# Patient Record
Sex: Female | Born: 1980 | Race: White | Hispanic: No | Smoking: Never smoker
Health system: Southern US, Community
[De-identification: ages and names within clinical notes are randomized; demographics above are authoritative.]

## PROBLEM LIST (undated history)

## (undated) DIAGNOSIS — F988 Other specified behavioral and emotional disorders with onset usually occurring in childhood and adolescence: Secondary | ICD-10-CM

## (undated) DIAGNOSIS — F419 Anxiety disorder, unspecified: Secondary | ICD-10-CM

## (undated) HISTORY — DX: Other specified behavioral and emotional disorders with onset usually occurring in childhood and adolescence: F98.8

## (undated) HISTORY — DX: Anxiety disorder, unspecified: F41.9

---
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
## 2001-06-26 ENCOUNTER — Other Ambulatory Visit: Admission: RE | Admit: 2001-06-26 | Discharge: 2001-06-26 | Payer: Self-pay | Admitting: *Deleted

## 2002-11-12 ENCOUNTER — Other Ambulatory Visit: Admission: RE | Admit: 2002-11-12 | Discharge: 2002-11-12 | Payer: Self-pay | Admitting: *Deleted

## 2003-06-06 ENCOUNTER — Observation Stay (HOSPITAL_COMMUNITY): Admission: AD | Admit: 2003-06-06 | Discharge: 2003-06-07 | Payer: Self-pay | Admitting: Obstetrics and Gynecology

## 2003-07-11 ENCOUNTER — Inpatient Hospital Stay (HOSPITAL_COMMUNITY): Admission: AD | Admit: 2003-07-11 | Discharge: 2003-07-12 | Payer: Self-pay | Admitting: Obstetrics and Gynecology

## 2003-09-09 ENCOUNTER — Inpatient Hospital Stay (HOSPITAL_COMMUNITY): Admission: AD | Admit: 2003-09-09 | Discharge: 2003-09-09 | Payer: Self-pay | Admitting: Obstetrics and Gynecology

## 2003-09-15 ENCOUNTER — Other Ambulatory Visit: Admission: RE | Admit: 2003-09-15 | Discharge: 2003-09-15 | Payer: Self-pay | Admitting: Obstetrics and Gynecology

## 2003-09-30 ENCOUNTER — Other Ambulatory Visit: Admission: RE | Admit: 2003-09-30 | Discharge: 2003-09-30 | Payer: Self-pay | Admitting: Obstetrics and Gynecology

## 2003-10-14 ENCOUNTER — Inpatient Hospital Stay (HOSPITAL_COMMUNITY): Admission: AD | Admit: 2003-10-14 | Discharge: 2003-10-14 | Payer: Self-pay | Admitting: Obstetrics and Gynecology

## 2003-12-10 ENCOUNTER — Inpatient Hospital Stay (HOSPITAL_COMMUNITY): Admission: AD | Admit: 2003-12-10 | Discharge: 2003-12-11 | Payer: Self-pay | Admitting: Obstetrics and Gynecology

## 2003-12-11 ENCOUNTER — Inpatient Hospital Stay (HOSPITAL_COMMUNITY): Admission: AD | Admit: 2003-12-11 | Discharge: 2003-12-13 | Payer: Self-pay | Admitting: Obstetrics and Gynecology

## 2003-12-14 ENCOUNTER — Encounter: Admission: RE | Admit: 2003-12-14 | Discharge: 2004-01-13 | Payer: Self-pay | Admitting: Obstetrics and Gynecology

## 2004-06-22 ENCOUNTER — Other Ambulatory Visit: Admission: RE | Admit: 2004-06-22 | Discharge: 2004-06-22 | Payer: Self-pay | Admitting: Obstetrics and Gynecology

## 2005-07-17 ENCOUNTER — Other Ambulatory Visit: Admission: RE | Admit: 2005-07-17 | Discharge: 2005-07-17 | Payer: Self-pay | Admitting: Obstetrics and Gynecology

## 2006-12-26 ENCOUNTER — Inpatient Hospital Stay (HOSPITAL_COMMUNITY): Admission: AD | Admit: 2006-12-26 | Discharge: 2006-12-28 | Payer: Self-pay | Admitting: Obstetrics and Gynecology

## 2007-08-06 ENCOUNTER — Other Ambulatory Visit: Admission: RE | Admit: 2007-08-06 | Discharge: 2007-08-06 | Payer: Self-pay | Admitting: Obstetrics and Gynecology

## 2007-11-08 ENCOUNTER — Emergency Department (HOSPITAL_COMMUNITY): Admission: EM | Admit: 2007-11-08 | Discharge: 2007-11-09 | Payer: Self-pay | Admitting: Emergency Medicine

## 2008-07-29 ENCOUNTER — Other Ambulatory Visit: Admission: RE | Admit: 2008-07-29 | Discharge: 2008-07-29 | Payer: Self-pay | Admitting: Obstetrics and Gynecology

## 2009-06-30 ENCOUNTER — Encounter: Payer: Self-pay | Admitting: Internal Medicine

## 2009-08-06 ENCOUNTER — Other Ambulatory Visit: Admission: RE | Admit: 2009-08-06 | Discharge: 2009-08-06 | Payer: Self-pay | Admitting: Obstetrics and Gynecology

## 2010-08-21 ENCOUNTER — Encounter: Payer: Self-pay | Admitting: Orthopedic Surgery

## 2010-12-13 NOTE — H&P (Signed)
[name], [name]               ACCOUNT NO.:  [id number]   MEDICAL RECORD NO.:  [id number]          PATIENT TYPE:  INP   LOCATION:                                [facility]:  [facility]   PHYSICIAN:  Charles A. Delcambre, MDDATE OF BIRTH:  1980-09-26   DATE OF ADMISSION:  12/26/2006  DATE OF DISCHARGE:                              HISTORY & PHYSICAL   HISTORY OF PRESENT ILLNESS:  This patient is a 30 year old para 1, 0-2-  1.  EDC will be Dec 26, 2006.  She has blood pressures increasing with  headache today, some scotomata, and contractions were about every 6  minutes.  Blood pressures are in the 140s/90s, 142/92, 144/96.  We did  get a set of PIH labs, and these were normal with creatinine 0.6, uric  acid 5.3, AST 16, ALT 11, hemoglobin 11.9, hematocrit 34.7, and  platelets 189.  With these symptoms, blood pressure and symptomatic  headache, that is off and on at this point, we will go ahead and induce  her tomorrow.  I do not feel she warrants emergent admission at this  time.  She is given precautions regarding further headache or her  scotomata, to report to the hospital immediately.   PAST MEDICAL HISTORY:  Insomnia, ADD.   PAST SURGICAL HISTORY:  SVD x1.   MEDICATIONS:  1. Xanax.  2. Ambien.  3. Adderall.  4. Tandem.  5. Axert.   ALLERGIES:  NO KNOWN DRUG ALLERGIES.   SOCIAL HISTORY:  No tobacco, ethanol, or drug exposure in the past.  She  is married, in a monogamous relationship with her husband.  She had high  risk __________ in the past.   FAMILY HISTORY:  Denies family history of breast, uterus, ovary, cervix,  colon cancer, coronary artery disease, stroke, diabetes, hypertension.   REVIEW OF SYSTEMS:  No fever, chills, rashes or lesions.  She has had a  headache as noted above.  Some vision, blurred vision, and scotomata,  but these have resolved.  No dizziness.  No seasonal allergies.  No  chest pain, shortness of breath, wheezing.  No diarrhea, constipation,  bleeding, melena, hematochezia, urgency, frequency, dysuria,  incontinence, hematuria, galactorrhea, emotional changes.   PHYSICAL EXAMINATION:  GENERAL:  Alert and oriented x3 in no distress.  HEENT:  Grossly within normal limits.  NECK:  Supple without thyromegaly or adenopathy.  CHEST:  Lungs clear bilaterally.  HEART:  Regular rate and rhythm.  A 2/6 systolic ejection murmur at left  sternal border.  BREASTS:  No masses, tenderness, discharge, skin or nipple changes  bilaterally.  ABDOMEN:  Fundal height of 40.  Estimated fetal weight 3400 grams.  PELVIC:  The cervix is 3 cm, 50 percent effaced, mid placed, -1 station,  vertex, back water bulging slightly, soft.  Otherwise, normal external  female genitalia.  EXTREMITIES:  Mild edema of lower extremities bilaterally.  Deep tendon  reflexes are 2+ and symmetrical bilaterally.   ASSESSMENT:  1. Intrauterine pregnancy at 30 weeks and induction.  2. Pregnancy-associated hypertension.  PIH labs are negative today.      Urinalysis  is negative for protein today.  She has 5 pounds of      weight gain over the last week.   PLAN:  We will go ahead and get her induced.  Plan induction at 9:39 to  9:30 p.m. tomorrow night with low dose Pitocin.  If blood pressures  elevate further, consider mag prophylaxis and repeat PIH panel.  Otherwise, she will present as directed.  Blood type is AB negative.  Antibody negative.  VDRL nonreactive.  Rubella immune.  Hepatitis B  surface antigen negative.  HIV nonreactive.  One hour Glucola 71.  Antibody screen negative.  Hemoglobin 28 weeks 11.7.  HIV negative 36  weeks.  Group B strep negative.  She was group B strep positive last  pregnancy but had no sequela.  She declined quad screen and declined  first trimester screen.  Cystic fibrosis was negative.  Pap was  negative.  GC/Chlamydia initially were negative.  TSH was negative.  We  will proceed as outlined.      Charles A. Sydnee Cabal, MD   Electronically Signed     CAD/MEDQ  D:  12/25/2006  T:  12/25/2006  Job:  [id number]

## 2010-12-16 NOTE — Consult Note (Signed)
NAME:  [name], [name]                          ACCOUNT NO.:  [id number]   MEDICAL RECORD NO.:  [id number]                   PATIENT TYPE:  MAT   LOCATION:  [facility]                                 [facility]ACILITY:  [facility]   PHYSICIAN:  Juan H. Lily Peer, M.D.             DATE OF BIRTH:  1981-04-26   DATE OF CONSULTATION:  07/11/2003  DATE OF DISCHARGE:  07/12/2003                                   CONSULTATION   CHIEF COMPLAINT:  Nausea and vomiting.   HISTORY:  The patient is a 30 year old gravida 3, para 0, AB 2 currently at  [redacted] weeks gestation who presented to [facility] today complaining of a  three-day history of nausea and vomiting, which started with some diarrhea.  Today she has been unable to keep any food down or any liquids, and  presented with these complaints.   PHYSICAL EXAMINATION:  GENERAL APPEARANCE:  On arrival to the emergency room  she is alert and oriented, and in no acute distress and in good spirits.  VITAL SIGNS:  The patient's vital signs are as follows; her temperature is  98.5, pulse 64, respirations 18 and blood pressure 111/53.  Fetal heart  tones  145-155.  HEENT:  Head, eyes, ears, nose, and mouth are unremarkable.  Of note, the  oral mucosa is tenacious with saliva with some mild dehydration evident.  NECK:  Neck supple.  Trachea midline.  No carotid bruits.  No thyromegaly.  HEART:  Heart is regular rate and rhythm.  No murmurs or gallops.  BREASTS AND PELVIC:  Breast and pelvic exams are not done.  ABDOMEN:  There is no abdominal tenderness.  Positive fetal heart tones are  appreciated.   LABORATORY DATA:  The patient underwent a urinalysis, which demonstrated a  pH of 6.0, specific gravity 1.020, there was greater than 80 mg/dcl of  ketones and no evidence of infection.  Her white blood count was 11.2,  hemoglobin and hematocrit were 12.1 and 35.3 respectively with a platelet  count of 211,000.  Serum electrolytes:  Sodium 135, potassium 3.4, chloride  104, and  CO2 25.   ASSESSMENT:  [age]-year-old gravida 3, para 0, AB 2 at [redacted] weeks  gestation with apparent viral gastroenteritis and some mild ketonuria noted  on clean catch urinalysis.   The patient will be hydrated with D5 LR with a 500 mL bolus and we will give  her 10 mg of Reglan mixed with a liter of IV fluids.  We will complete the  entire liter of IV fluids and then recheck her urine.  Then after ketones  have resolved we will release the patient home with a prescription for  Zofran ODT 8 mg sublingual one daily p.r.n. over the next 24-48 hours.  She  was instructed that once at home to stay on liquids for  a 24-hour period to  allow for GI tract rest; and, for loose stools  she may take Imodium or  Kaopectate p.r.n.  After 24 hours she can advance her diet to soft and then  finally to a regular diet.  If there is no resolution to the above symptoms  with the diarrhea within the  72 hours she will present to the office of  Dr. Sylvester Harder and possibly  consider a stool for ova and parasites.  The patient denies her husband or  any other family members with any similar complaints.   All questions were answered and we will follow accordingly.                                               Juan H. Lily Peer, M.D.    JHF/MEDQ  D:  07/11/2003  T:  07/12/2003  Job:  [id number]   cc:   Fayrene Fearing A. Ashley Royalty, M.D.  [address]  [place], [place] [address]  Fax: [phone]

## 2010-12-16 NOTE — H&P (Signed)
NAME:  [name], [name]                          ACCOUNT NO.:  [id number]   MEDICAL RECORD NO.:  [id number]                   PATIENT TYPE:  OBV   LOCATION:  9325                                 [facility]ACILITY:  [facility]   PHYSICIAN:  Timothy P. Fontaine, M.D.           DATE OF BIRTH:  02/11/1981   DATE OF ADMISSION:  06/06/2003  DATE OF DISCHARGE:                                HISTORY & PHYSICAL   CHIEF COMPLAINT:  Nausea, vomiting, pregnancy.   HISTORY OF PRESENT ILLNESS:  A 30 year old G3, P0, AB 2, female at  approximately 18 weeks' gestation who enters with increasing nausea and  vomiting over the last 48 to 72 hours.  The patient notes she has always had  a problem during the pregnancy with some nausea and vomiting.  She has been  on oral Phenergan.  She was actually given a prescription for Phenergan  suppositories although never filled them.  Noted since Friday evening  progressive nausea and vomiting such that she could not keep anything down.  She has no fevers or chills.  She has a little bit of diarrhea and otherwise  she has had an uncomplicated pregnancy per her history.   PAST MEDICAL HISTORY:  Uncomplicated.   PAST SURGICAL HISTORY:  D&C.   ALLERGIES:  Allergies with no medications.   FAMILY HISTORY:  Noncontributory.   SOCIAL HISTORY:  Noncontributory.   ADMISSION PHYSICAL EXAM:  VITAL SIGNS:  Afebrile.  Vital signs are stable.  HEENT:  Normal.  LUNGS:  Clear.  CARDIAC:  Regular rate without rubs, murmurs, or gallops.  ABDOMEN:  Exam with active bowel sounds, soft, nontender without masses,  guarding, rebound, or organomegaly.  Positive fetal heart tones per nursing.  PELVIC:  Deferred.   LABORATORY EVALUATION:  Normal comprehensive metabolic panel with the  exception of a marginal low sodium at 133 and a low glucose at 66.  Normal  liver functions.  Urine analysis is negative with the exception of greater  than 80 ketones.  CBC:  White count 10.8, hemoglobin 12.9,  platelet count  185,000.   ASSESSMENT AND PLAN:  A 30 year old G3, P0, approximately 13 weeks'  gestation.  History of  nausea and vomiting with pregnancy with acute  exacerbation, questionable viral component versus hyperemesis.  The patient  has evidence of dehydration with positive ketones in her urine.  Admit.  IV  hydrate.  Phenergan IV.  The patient relates feeling better after receiving  two liters of IV fluid, currently having the third.  Will finish the third  liter, and if she continues to feel well, we will discharge home.  She will  have the prescription for Phenergan suppositories filled  and will use these on a as needed basis.  Follow up with Dr. Ashley Royalty the  day after discharge in the office.  She does have an appointment at the end  of the week routinely and she will keep  that appointment.  She will call his  office sooner if she has recurrent nausea and vomiting.                                               Timothy P. Audie Box, M.D.    TPF/MEDQ  D:  06/07/2003  T:  06/07/2003  Job:  [id number]

## 2010-12-16 NOTE — H&P (Signed)
NAME:  [name], [name]                          ACCOUNT NO.:  [id number]   MEDICAL RECORD NO.:  [id number]                   PATIENT TYPE:  INP   LOCATION:  9164                                 [facility]ILITY:  [facility]   PHYSICIAN:  Juan H. Lily Peer, M.D.             DATE OF BIRTH:  April 06, 1981   DATE OF ADMISSION:  12/11/2003  DATE OF DISCHARGE:                                HISTORY & PHYSICAL   CHIEF COMPLAINT:  Spontaneous rupture of membranes.   HISTORY:  The patient is a 30 year old gravida 3 para 0 AB 2 currently [redacted]  weeks gestation who has been seen in Dr. Leonette Most Delcambre's office this  morning for routine visit and he was in the process of planning induction  next week due to the possibility the patient may have underlying labile PIH.  This afternoon she called and presented to the emergency room complaining of  ruptured membranes.  She was nitrazine positive and gross rupture of  membranes, clear, was evident.  Her cervix is 3-4 cm, 80-90%, -3 station and  she was brought in to be delivered.  Review of her [place] form is  somewhat limited, but based on the patient's history due to the fact that  she is AB negative she received RhoGAM at 36 weeks - she stated it was  approximately 3 weeks ago.  No other abnormalities reported in her pregnancy  except a labile PIH and her positive group B strep culture.   PAST MEDICAL HISTORY:  The patient with two spontaneous ABs in the past and  she denied any other underlying medical problems.   REVIEW OF SYSTEMS:  See [place] form.   PHYSICAL EXAMINATION:  VITAL SIGNS:  Blood pressure 145/84 and when she was  supine was 144/90, lateral 132/59.  Pulse 100, respirations 20, temperature  98.2.  HEENT:  Unremarkable.  NECK:  Supple, trachea midline, no carotid bruits, no thyromegaly.  LUNGS:  Clear to auscultation without rhonchi or wheezes.  HEART:  Regular rate and rhythm, no murmurs or gallop.  BREAST:  Exam not done.  ABDOMEN:  Gravid  uterus, vertex presentation by [employer].  PELVIC:  Cervix was 3-4 cm, 80-90% effaced, -3 station, with gross rupture  of membranes.  EXTREMITIES:  Show 1+ pitting edema, DTR 1-2+, negative clonus.   PRENATAL LABORATORY DATA:  AB negative blood type, negative antibody screen.  VDRL was nonreactive.  Rubella immune.  Hepatitis B surface antigen was  negative.  GC and chlamydia cultures were negative.  Positive GBS reported.   ASSESSMENT:  A 30 year old gravida 3 para 0 at 1 weeks estimated  gestational age with labile PIH with spontaneous rupture of membranes,  history of positive group B streptococcus.  Plan on augmenting with Pitocin  due to the minimal labor at the present time.  Reassuring fetal heart rate  tracing.  Will also proceed with getting a PIH panel, continue to monitor  blood pressures, and if elevated then will initiate magnesium sulfate for  seizure prophylaxis.  She will receive penicillin G 5,000,000 unit bolus  followed by 2,500,000 units IV q.4h. hours due to her positive history of  GBS.  Will also offer an epidural upon request for pain relief.   PLAN:  1. As per assessment above.  2. Anticipate vaginal delivery.                                               Juan H. Lily Peer, M.D.    JHF/MEDQ  D:  12/11/2003  T:  12/11/2003  Job:  [id number]   cc:   Leonette Most A. Sydnee Cabal, MD  Fax: [phone]

## 2010-12-16 NOTE — Discharge Summary (Signed)
NAME:  LIBRA, GATZ                          ACCOUNT NO.:  [id number]   MEDICAL RECORD NO.:  [id number]                   PATIENT TYPE:  OBV   LOCATION:  9325                                 [facility]ILITY:  [facility]   PHYSICIAN:  Timothy P. Fontaine, M.D.           DATE OF BIRTH:  Aug 07, 1980   DATE OF ADMISSION:  06/06/2003  DATE OF DISCHARGE:  06/07/2003                                 DISCHARGE SUMMARY   DISCHARGE DIAGNOSES:  1. Intrauterine pregnancy approximately 13 weeks undelivered.  2. Nausea and vomiting.   HISTORY OF PRESENT ILLNESS:  This is a 30 year old female, G3, P0, AB2 who  presented to triage complaining of increasing nausea and vomiting over the  last 48-72 hours.  The patient noted she always had a problem during  pregnancy with some nausea and vomiting and had been given oral Phenergan.  She had been given a prescription for Phenergan suppository, although never  filled them.  Since the prior Friday evening, she had progressive nausea and  vomiting to the point she could not keep anything down.  She was therefore  admitted for 23-hour observation for IV hydration.   HOSPITAL COURSE:  The patient was admitted at 13 weeks.  She had normal  comprehensive metabolic panel with the exception of marginally low sodium at  133 and a low glucose of 66.  She had normal liver function tests.  She had  urinalysis that was negative with the exception of greater than 80 ketones.  CBC revealed a white count of 10.8 with hemoglobin 12.9.  With this history  of nausea and vomiting with pregnancy with acute exacerbation with viral  component versus hyperemesis, the patient was begun on IV hydration with  Phenergan IV.  The patient felt better after receiving 2 L of IV fluid.  At  the time on physician rounds, the patient continued to feel well and was  felt stable for discharge.   DISCHARGE MEDICATIONS:  Phenergan suppositories on a p.r.n. basis.   FOLLOW UP:  Follow up with Fayrene Fearing A.  Ashley Royalty, M.D. the day after the  discharge.  If she had any problems prior to that time, she could be seen in  the office.     Susa Loffler, P.A.                    Timothy P. Audie Box, M.D.    TSG/MEDQ  D:  06/29/2003  T:  06/29/2003  Job:  [id number]

## 2011-01-08 ENCOUNTER — Emergency Department (HOSPITAL_BASED_OUTPATIENT_CLINIC_OR_DEPARTMENT_OTHER)
Admission: EM | Admit: 2011-01-08 | Discharge: 2011-01-08 | Disposition: A | Discharge status: Home / Self Care | Payer: Medicaid Other | Attending: Emergency Medicine | Admitting: Emergency Medicine

## 2011-01-08 DIAGNOSIS — W108XXA Fall (on) (from) other stairs and steps, initial encounter: Secondary | ICD-10-CM | POA: Insufficient documentation

## 2011-01-08 DIAGNOSIS — I10 Essential (primary) hypertension: Secondary | ICD-10-CM | POA: Insufficient documentation

## 2011-01-08 DIAGNOSIS — Y92009 Unspecified place in unspecified non-institutional (private) residence as the place of occurrence of the external cause: Secondary | ICD-10-CM | POA: Insufficient documentation

## 2011-01-08 DIAGNOSIS — Z79899 Other long term (current) drug therapy: Secondary | ICD-10-CM | POA: Insufficient documentation

## 2011-01-08 DIAGNOSIS — S0003XA Contusion of scalp, initial encounter: Secondary | ICD-10-CM | POA: Insufficient documentation

## 2011-04-25 LAB — RAPID URINE DRUG SCREEN, HOSP PERFORMED
Barbiturates: NOT DETECTED
Benzodiazepines: POSITIVE — AB
Cocaine: NOT DETECTED

## 2011-04-25 LAB — DIFFERENTIAL
Basophils Absolute: 0.1
Basophils Relative: 1
Lymphocytes Relative: 34
Monocytes Absolute: 0.4
Monocytes Relative: 6
Neutro Abs: 4.7
Neutrophils Relative %: 58

## 2011-04-25 LAB — BASIC METABOLIC PANEL
CO2: 27
GFR calc non Af Amer: >60
Glucose, Bld: 93

## 2011-04-25 LAB — URINALYSIS, ROUTINE W REFLEX MICROSCOPIC
Specific Gravity, Urine: 1.02
pH: 6

## 2011-04-25 LAB — CBC
RBC: 4.3
RDW: 14.1

## 2011-04-25 LAB — PREGNANCY, URINE: Preg Test, Ur: NEGATIVE

## 2012-07-30 ENCOUNTER — Other Ambulatory Visit (HOSPITAL_COMMUNITY)
Admission: RE | Admit: 2012-07-30 | Discharge: 2012-07-30 | Disposition: A | Discharge status: Home / Self Care | Payer: Medicaid Other | Source: Ambulatory Visit | Attending: Obstetrics and Gynecology | Admitting: Obstetrics and Gynecology

## 2012-07-30 ENCOUNTER — Encounter (HOSPITAL_COMMUNITY): Payer: Self-pay

## 2012-07-30 ENCOUNTER — Ambulatory Visit (HOSPITAL_COMMUNITY)
Admission: RE | Admit: 2012-07-30 | Discharge: 2012-07-30 | Disposition: A | Discharge status: Home / Self Care | Payer: Medicaid Other | Source: Ambulatory Visit | Attending: Obstetrics and Gynecology | Admitting: Obstetrics and Gynecology

## 2012-07-30 ENCOUNTER — Other Ambulatory Visit (HOSPITAL_COMMUNITY): Payer: Self-pay | Admitting: Obstetrics and Gynecology

## 2012-07-30 ENCOUNTER — Other Ambulatory Visit: Payer: Self-pay | Admitting: Obstetrics and Gynecology

## 2012-07-30 DIAGNOSIS — O3680X0 Pregnancy with inconclusive fetal viability, not applicable or unspecified: Secondary | ICD-10-CM

## 2012-07-30 DIAGNOSIS — Z3689 Encounter for other specified antenatal screening: Secondary | ICD-10-CM | POA: Insufficient documentation

## 2012-07-30 DIAGNOSIS — Z113 Encounter for screening for infections with a predominantly sexual mode of transmission: Secondary | ICD-10-CM | POA: Insufficient documentation

## 2012-07-30 DIAGNOSIS — Z01419 Encounter for gynecological examination (general) (routine) without abnormal findings: Secondary | ICD-10-CM | POA: Insufficient documentation

## 2013-05-08 ENCOUNTER — Emergency Department (HOSPITAL_COMMUNITY)
Admission: EM | Admit: 2013-05-08 | Discharge: 2013-05-08 | Disposition: A | Discharge status: Home / Self Care | Payer: Medicaid Other | Attending: Emergency Medicine | Admitting: Emergency Medicine

## 2013-05-08 ENCOUNTER — Emergency Department (HOSPITAL_COMMUNITY): Payer: Medicaid Other

## 2013-05-08 ENCOUNTER — Encounter (HOSPITAL_COMMUNITY): Payer: Self-pay | Admitting: Emergency Medicine

## 2013-05-08 DIAGNOSIS — Y9389 Activity, other specified: Secondary | ICD-10-CM | POA: Insufficient documentation

## 2013-05-08 DIAGNOSIS — R0602 Shortness of breath: Secondary | ICD-10-CM | POA: Insufficient documentation

## 2013-05-08 DIAGNOSIS — S0993XA Unspecified injury of face, initial encounter: Secondary | ICD-10-CM | POA: Insufficient documentation

## 2013-05-08 DIAGNOSIS — IMO0002 Reserved for concepts with insufficient information to code with codable children: Secondary | ICD-10-CM | POA: Insufficient documentation

## 2013-05-08 DIAGNOSIS — F29 Unspecified psychosis not due to a substance or known physiological condition: Secondary | ICD-10-CM | POA: Insufficient documentation

## 2013-05-08 DIAGNOSIS — Y92410 Unspecified street and highway as the place of occurrence of the external cause: Secondary | ICD-10-CM | POA: Insufficient documentation

## 2013-05-08 DIAGNOSIS — S298XXA Other specified injuries of thorax, initial encounter: Secondary | ICD-10-CM | POA: Insufficient documentation

## 2013-05-08 MED ORDER — OXYCODONE-ACETAMINOPHEN 5-325 MG PO TABS: 1.0000 | ORAL_TABLET | ORAL | Status: DC | PRN

## 2013-05-08 MED ORDER — OXYCODONE-ACETAMINOPHEN 5-325 MG PO TABS
1.0000 | ORAL_TABLET | Freq: Once | ORAL | Status: AC
  Administered 2013-05-08: 1 via ORAL
  Filled 2013-05-08 (×2): qty 1
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
## 2013-05-09 NOTE — ED Provider Notes (Signed)
Medical screening examination/treatment/procedure(s) were performed by non-physician practitioner and as supervising physician I was immediately available for consultation/collaboration.   Mercie Balsley H Rickey Farrier, MD 05/09/13 1333 
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
## 2013-11-11 ENCOUNTER — Telehealth: Payer: Self-pay | Admitting: Internal Medicine

## 2013-11-11 NOTE — Telephone Encounter (Signed)
Patient called to request migraine management medicine. Requested: Axert, zofran, and vicodin to Pharm: [facility] [address]  Patient going to work so please advise via text  Thanks, Katrina

## 2013-11-12 ENCOUNTER — Telehealth: Payer: Self-pay | Admitting: *Deleted

## 2013-11-12 NOTE — Telephone Encounter (Signed)
PT scheduled a appt for next WK with you, she wants to know can we call in something? A wks worth to get pt through till appt?

## 2013-11-18 DIAGNOSIS — F988 Other specified behavioral and emotional disorders with onset usually occurring in childhood and adolescence: Secondary | ICD-10-CM | POA: Insufficient documentation

## 2013-11-18 DIAGNOSIS — F419 Anxiety disorder, unspecified: Secondary | ICD-10-CM | POA: Insufficient documentation

## 2013-11-19 ENCOUNTER — Encounter: Payer: Self-pay | Admitting: Internal Medicine

## 2013-11-19 NOTE — Progress Notes (Signed)
Subjective:     Patient ID: [name], female   DOB: 12-02-1980, 33 y.o.   MRN: [id number]  HPI  No Show   Review of Systems     Objective:   Physical Exam     Assessment:         Plan:

## 2013-11-20 ENCOUNTER — Encounter: Payer: Self-pay | Admitting: Internal Medicine

## 2014-11-05 ENCOUNTER — Ambulatory Visit (INDEPENDENT_AMBULATORY_CARE_PROVIDER_SITE_OTHER): Payer: BLUE CROSS/BLUE SHIELD | Admitting: Internal Medicine

## 2014-11-05 ENCOUNTER — Encounter: Payer: Self-pay | Admitting: Internal Medicine

## 2014-11-05 ENCOUNTER — Telehealth: Payer: Self-pay

## 2014-11-05 VITALS — BP 120/78 | HR 84 | Temp 97.7°F | Resp 16 | Ht 62.5 in | Wt 170.0 lb

## 2014-11-05 DIAGNOSIS — J069 Acute upper respiratory infection, unspecified: Secondary | ICD-10-CM

## 2014-11-05 MED ORDER — MOMETASONE FUROATE 50 MCG/ACT NA SUSP: 2.0000 | Freq: Every day | NASAL | Status: DC

## 2014-11-05 MED ORDER — LIDOCAINE VISCOUS 2 % MT SOLN: 20.0000 mL | OROMUCOSAL | Status: DC | PRN

## 2014-11-05 MED ORDER — AZITHROMYCIN 250 MG PO TABS: ORAL_TABLET | ORAL | Status: DC

## 2014-11-05 MED ORDER — BENZONATATE 100 MG PO CAPS: 100.0000 mg | ORAL_CAPSULE | Freq: Four times a day (QID) | ORAL | Status: AC | PRN

## 2014-11-05 MED ORDER — PREDNISONE 20 MG PO TABS: ORAL_TABLET | ORAL | Status: DC

## 2014-11-05 NOTE — Progress Notes (Signed)
   Subjective:    Patient ID: [name], female    DOB: 28-Jan-1981, 34 y.o.   MRN: [id number]  HPI  Patient presents to the office for evaluation of laryngitis and uri symptoms.  She reports that she has had no voice.  She has been trying antihistamines at home.  She recently took a zpak for strep throat.  She reports that her kids are sick.  She recently took her kids to the pediatrician for viral illness vs. Allergies.  This has been going on since last Thursday.  Her voice has gotten to the point where she can't speak and her job is answering phones.  Review of Systems  Constitutional: Negative for fever, chills and fatigue.  HENT: Positive for postnasal drip, rhinorrhea, sinus pressure, sneezing and sore throat. Negative for congestion, ear pain, tinnitus and voice change.   Eyes: Positive for discharge and itching.  Respiratory: Positive for cough and shortness of breath (mild). Negative for chest tightness and wheezing.   Cardiovascular: Negative for chest pain and palpitations.  Gastrointestinal: Negative for nausea, vomiting, abdominal pain, diarrhea and constipation.       Objective:   Physical Exam  Constitutional: She is oriented to person, place, and time. She appears well-developed and well-nourished. No distress.  HENT:  Head: Normocephalic and atraumatic.  Nose: Mucosal edema present.  Mouth/Throat: Uvula is midline and mucous membranes are normal. No trismus in the jaw. Posterior oropharyngeal edema and posterior oropharyngeal erythema present. No oropharyngeal exudate.  Eyes: Conjunctivae and EOM are normal. Pupils are equal, round, and reactive to light. No scleral icterus.  Neck: Normal range of motion. Neck supple. No JVD present. No thyromegaly present.  Cardiovascular: Normal rate, regular rhythm, normal heart sounds and intact distal pulses.  Exam reveals no gallop and no friction rub.   No murmur heard. Pulmonary/Chest: Effort normal and breath sounds normal.  No respiratory distress. She has no wheezes. She has no rales. She exhibits no tenderness.  Abdominal: Soft. Bowel sounds are normal. She exhibits no distension and no mass. There is no tenderness. There is no rebound and no guarding.  Musculoskeletal: Normal range of motion.  Lymphadenopathy:    She has no cervical adenopathy.  Neurological: She is alert and oriented to person, place, and time.  Skin: Skin is warm and dry. She is not diaphoretic.  Psychiatric: She has a normal mood and affect. Her behavior is normal. Judgment and thought content normal.  Nursing note and vitals reviewed.         Assessment & Plan:    1. Acute URI Patient likely viral uri with laryngitis vs. Allergic rhinitis.    -zpak to be taken only if no improvement in 3 days - predniSONE (DELTASONE) 20 MG tablet; 3 tabs po day one, then 2 tabs daily x 4 days  Dispense: 11 tablet; Refill: 0 - benzonatate (TESSALON PERLES) 100 MG capsule; Take 1 capsule (100 mg total) by mouth every 6 (six) hours as needed for cough.  Dispense: 30 capsule; Refill: 1 - lidocaine (XYLOCAINE) 2 % solution; Use as directed 20 mLs in the mouth or throat as needed for mouth pain.  Dispense: 100 mL; Refill: 0 - mometasone (NASONEX) 50 MCG/ACT nasal spray; Place 2 sprays into the nose daily.  Dispense: 17 g; Refill: 2

## 2014-11-05 NOTE — Telephone Encounter (Signed)
Patient went to pharmacy to pick up medication and said there was a prescription for a Zpak. Patient thought you said to not take the Zpak since she took Zpak 2 weeks ago. Do you want patient to take Zpak? Please advise.

## 2014-11-05 NOTE — Patient Instructions (Signed)
Laryngitis °At the top of your windpipe is your voice box. It is the source of your voice. Inside your voice box are 2 bands of muscles called vocal cords. When you breathe, your vocal cords are relaxed and open so that air can get into the lungs. When you decide to say something, these cords come together and vibrate. The sound from these vibrations goes into your throat and comes out through your mouth as sound.  °Laryngitis is an inflammation of the vocal cords that causes hoarseness, cough, loss of voice, sore throat, and dry throat. Laryngitis can be temporary (acute) or long-term (chronic). Most cases of acute laryngitis improve with time.Chronic laryngitis lasts for more than 3 weeks. °CAUSES °Laryngitis can often be related to excessive smoking, talking, or yelling, as well as inhalation of toxic fumes and allergies. Acute laryngitis is usually caused by a viral infection, vocal strain, measles or mumps, or bacterial infections. Chronic laryngitis is usually caused by vocal cord strain, vocal cord injury, postnasal drip, growths on the vocal cords, or acid reflux. °SYMPTOMS  °· Cough. °· Sore throat. °· Dry throat. °RISK FACTORS °· Respiratory infections. °· Exposure to irritating substances, such as cigarette smoke, excessive amounts of alcohol, stomach acids, and workplace chemicals. °· Voice trauma, such as vocal cord injury from shouting or speaking too loud. °DIAGNOSIS  °Your cargiver will perform a physical exam. During the physical exam, your caregiver will examine your throat. The most common sign of laryngitis is hoarseness. Laryngoscopy may be necessary to confirm the diagnosis of this condition. This procedure allows your caregiver to look into the larynx. °HOME CARE INSTRUCTIONS °· Drink enough fluids to keep your urine clear or pale yellow. °· Rest until you no longer have symptoms or as directed by your caregiver. °· Breathe in moist air. °· Take all medicine as directed by your  caregiver. °· Do not smoke. °· Talk as little as possible (this includes whispering). °· Write on paper instead of talking until your voice is back to normal. °· Follow up with your caregiver if your condition has not improved after 10 days. °SEEK MEDICAL CARE IF:  °· You have trouble breathing. °· You cough up blood. °· You have persistent fever. °· You have increasing pain. °· You have difficulty swallowing. °MAKE SURE YOU: °· Understand these instructions. °· Will watch your condition. °· Will get help right away if you are not doing well or get worse. °Document Released: 07/17/2005 Document Revised: 10/09/2011 Document Reviewed: 09/22/2010 °[employer]® Patient Information ©2015 [employer]. This information is not intended to replace advice given to you by your health care provider. Make sure you discuss any questions you have with your health care provider. ° °

## 2014-11-09 ENCOUNTER — Telehealth: Payer: Self-pay

## 2014-11-09 NOTE — Telephone Encounter (Signed)
Patient was seen on 11-05-14. Patient returned to work today but is not feeling any better. Is there another ABX patient can start? Please advise.

## 2014-11-11 ENCOUNTER — Other Ambulatory Visit: Payer: Self-pay | Admitting: Internal Medicine

## 2014-11-11 DIAGNOSIS — J069 Acute upper respiratory infection, unspecified: Secondary | ICD-10-CM

## 2014-11-11 MED ORDER — MONTELUKAST SODIUM 10 MG PO TABS: 10.0000 mg | ORAL_TABLET | Freq: Every day | ORAL | Status: DC

## 2014-11-11 MED ORDER — PREDNISONE 20 MG PO TABS: ORAL_TABLET | ORAL | Status: DC

## 2014-11-11 NOTE — Progress Notes (Signed)
Mailbox full unable to leave message at 1:44pm.

## 2014-11-11 NOTE — Progress Notes (Signed)
Patient aware Prednisone and Singular rx sent into pharmacy. 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  She needs out of work note to cover her for today and states she will try Prednisone and Singular and see if any relief with symptoms for the rest of the week.  States her job could possible fire her is she continues to miss work without a doctor's note.

## 2014-11-12 ENCOUNTER — Other Ambulatory Visit: Payer: Self-pay | Admitting: Internal Medicine

## 2014-11-12 DIAGNOSIS — J069 Acute upper respiratory infection, unspecified: Secondary | ICD-10-CM

## 2014-11-12 MED ORDER — PREDNISONE 20 MG PO TABS: ORAL_TABLET | ORAL | Status: DC

## 2014-11-12 NOTE — Progress Notes (Signed)
Patient seen last week & treated with prednisone for laryngitis and persists with severe laryngitis as is evident in her voice over the phone. 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 New rx Prednisione for high dose prednisone sent in as well a note recommending that  she be out of work 04/13- 11/20/2014 to allow strict voice rest.

## 2014-11-28 ENCOUNTER — Other Ambulatory Visit: Payer: Self-pay | Admitting: Internal Medicine

## 2014-11-28 DIAGNOSIS — J209 Acute bronchitis, unspecified: Secondary | ICD-10-CM

## 2014-11-28 MED ORDER — LEVOFLOXACIN 750 MG PO TABS: 750.0000 mg | ORAL_TABLET | Freq: Every day | ORAL | Status: AC

## 2015-04-08 ENCOUNTER — Other Ambulatory Visit: Payer: Self-pay | Admitting: *Deleted

## 2015-04-08 MED ORDER — MONTELUKAST SODIUM 10 MG PO TABS: 10.0000 mg | ORAL_TABLET | Freq: Every day | ORAL | Status: AC

## 2015-09-21 ENCOUNTER — Ambulatory Visit (INDEPENDENT_AMBULATORY_CARE_PROVIDER_SITE_OTHER): Payer: BLUE CROSS/BLUE SHIELD

## 2015-09-21 DIAGNOSIS — Z9289 Personal history of other medical treatment: Secondary | ICD-10-CM | POA: Diagnosis not present

## 2015-09-27 LAB — TB SKIN TEST
INDURATION: 0 mm
TB Skin Test: NEGATIVE

## 2017-04-01 ENCOUNTER — Encounter (HOSPITAL_COMMUNITY): Payer: Self-pay

## 2017-04-01 ENCOUNTER — Emergency Department (HOSPITAL_COMMUNITY): Payer: Medicaid Other

## 2017-04-01 ENCOUNTER — Emergency Department (HOSPITAL_COMMUNITY)
Admission: EM | Admit: 2017-04-01 | Discharge: 2017-04-01 | Disposition: A | Discharge status: Home / Self Care | Payer: Medicaid Other | Attending: Emergency Medicine | Admitting: Emergency Medicine

## 2017-04-01 DIAGNOSIS — Y990 Civilian activity done for income or pay: Secondary | ICD-10-CM | POA: Insufficient documentation

## 2017-04-01 DIAGNOSIS — R569 Unspecified convulsions: Secondary | ICD-10-CM | POA: Diagnosis present

## 2017-04-01 DIAGNOSIS — F419 Anxiety disorder, unspecified: Secondary | ICD-10-CM | POA: Diagnosis not present

## 2017-04-01 DIAGNOSIS — W01198A Fall on same level from slipping, tripping and stumbling with subsequent striking against other object, initial encounter: Secondary | ICD-10-CM | POA: Insufficient documentation

## 2017-04-01 DIAGNOSIS — Y929 Unspecified place or not applicable: Secondary | ICD-10-CM | POA: Insufficient documentation

## 2017-04-01 DIAGNOSIS — F909 Attention-deficit hyperactivity disorder, unspecified type: Secondary | ICD-10-CM | POA: Insufficient documentation

## 2017-04-01 DIAGNOSIS — Y9389 Activity, other specified: Secondary | ICD-10-CM | POA: Insufficient documentation

## 2017-04-01 DIAGNOSIS — Z79899 Other long term (current) drug therapy: Secondary | ICD-10-CM | POA: Insufficient documentation

## 2017-04-01 DIAGNOSIS — S060X1A Concussion with loss of consciousness of 30 minutes or less, initial encounter: Secondary | ICD-10-CM

## 2017-04-01 LAB — MAGNESIUM: MAGNESIUM: 2 mg/dL (ref 1.7–2.4)

## 2017-04-01 LAB — BASIC METABOLIC PANEL
ANION GAP: 13 (ref 5–15)
BUN: 16 mg/dL (ref 6–20)
CHLORIDE: 102 mmol/L (ref 101–111)
CO2: 23 mmol/L (ref 22–32)
Calcium: 9.6 mg/dL (ref 8.9–10.3)
Creatinine, Ser: 0.96 mg/dL (ref 0.44–1.00)
GFR calc Af Amer: >60 mL/min (ref >60)
GLUCOSE: 101 mg/dL — AB (ref 65–99)
POTASSIUM: 3.7 mmol/L (ref 3.5–5.1)
Sodium: 138 mmol/L (ref 135–145)

## 2017-04-01 LAB — I-STAT BETA HCG BLOOD, ED (MC, WL, AP ONLY): I-stat hCG, quantitative: <5 m[IU]/mL (ref <5)

## 2017-04-01 LAB — CBC WITH DIFFERENTIAL/PLATELET
BASOS ABS: 0 10*3/uL (ref 0.0–0.1)
Basophils Relative: 0 %
Eosinophils Absolute: 0 10*3/uL (ref 0.0–0.7)
Eosinophils Relative: 0 %
HEMATOCRIT: 39.4 % (ref 36.0–46.0)
HEMOGLOBIN: 13.3 g/dL (ref 12.0–15.0)
LYMPHS ABS: 1.3 10*3/uL (ref 0.7–4.0)
LYMPHS PCT: 15 %
MCH: 30 pg (ref 26.0–34.0)
MCHC: 33.8 g/dL (ref 30.0–36.0)
MCV: 88.9 fL (ref 78.0–100.0)
MONO ABS: 0.4 10*3/uL (ref 0.1–1.0)
MONOS PCT: 4 %
NEUTROS ABS: 7.3 10*3/uL (ref 1.7–7.7)
Neutrophils Relative %: 81 %
PLATELETS: 224 10*3/uL (ref 150–400)
RBC: 4.43 MIL/uL (ref 3.87–5.11)
RDW: 12.6 % (ref 11.5–15.5)
WBC: 9 10*3/uL (ref 4.0–10.5)

## 2017-04-01 LAB — URINALYSIS, ROUTINE W REFLEX MICROSCOPIC
Bacteria, UA: NONE SEEN
Bilirubin Urine: NEGATIVE
GLUCOSE, UA: NEGATIVE mg/dL
Hgb urine dipstick: NEGATIVE
KETONES UR: 20 mg/dL — AB
Nitrite: NEGATIVE
PH: 5 (ref 5.0–8.0)
Protein, ur: 30 mg/dL — AB
Specific Gravity, Urine: 1.019 (ref 1.005–1.030)

## 2017-04-01 LAB — RAPID URINE DRUG SCREEN, HOSP PERFORMED
AMPHETAMINES: POSITIVE — AB
BARBITURATES: NOT DETECTED
BENZODIAZEPINES: POSITIVE — AB
Cocaine: NOT DETECTED
Opiates: NOT DETECTED
Tetrahydrocannabinol: NOT DETECTED

## 2017-04-01 LAB — CBG MONITORING, ED: Glucose-Capillary: 109 mg/dL — ABNORMAL HIGH (ref 65–99)

## 2017-04-01 MED ORDER — ALPRAZOLAM 0.25 MG PO TABS
2.0000 mg | ORAL_TABLET | Freq: Once | ORAL | Status: AC
  Administered 2017-04-01: 0.5 mg via ORAL
  Filled 2017-04-01: qty 8

## 2017-04-01 MED ORDER — ONDANSETRON HCL 4 MG/2ML IJ SOLN
4.0000 mg | Freq: Once | INTRAMUSCULAR | Status: AC
  Administered 2017-04-01: 4 mg via INTRAVENOUS
  Filled 2017-04-01: qty 2

## 2017-04-01 MED ORDER — ALPRAZOLAM 0.25 MG PO TABS
1.0000 mg | ORAL_TABLET | Freq: Once | ORAL | Status: AC
  Administered 2017-04-01: 1 mg via ORAL
  Filled 2017-04-01: qty 4

## 2017-04-01 MED ORDER — ONDANSETRON 4 MG PO TBDP: 4.0000 mg | ORAL_TABLET | Freq: Three times a day (TID) | ORAL | 0 refills | Status: DC | PRN

## 2017-04-01 MED ORDER — MECLIZINE HCL 25 MG PO TABS: 25.0000 mg | ORAL_TABLET | Freq: Three times a day (TID) | ORAL | 0 refills | Status: DC | PRN

## 2017-04-01 MED ORDER — ACETAMINOPHEN 325 MG PO TABS
650.0000 mg | ORAL_TABLET | Freq: Once | ORAL | Status: AC
  Administered 2017-04-01: 650 mg via ORAL
  Filled 2017-04-01: qty 2

## 2017-04-01 MED ORDER — SODIUM CHLORIDE 0.9 % IV BOLUS (SEPSIS)
1000.0000 mL | Freq: Once | INTRAVENOUS | Status: AC
  Administered 2017-04-01: 1000 mL via INTRAVENOUS

## 2017-04-01 NOTE — ED Triage Notes (Signed)
Pt presents to the ed with ems for falling forward at work and hitting her head and then started having a grand mal seizure lasting 30-60 seconds.  On fire departments arrival she was alert but combative and is now alert, oriented, cooperative and complaints of feeling fatigued.  cbg 120. Vs wnl. Pt has no history of seizures. No focal neuro deficits present. Complaints of headache.

## 2017-04-01 NOTE — ED Notes (Signed)
Pt ambulatory to rR with stand by assist.

## 2017-04-01 NOTE — Discharge Instructions (Addendum)
Do not drive, swim by yourself, get on a ladder, or take a bath by herself, or any other possible dangerous activities if you were to have another seizure. If you have another seizure, come back to the ER for evaluation. Otherwise, follow-up with the neurology group listed above for further workup.

## 2017-04-01 NOTE — ED Notes (Addendum)
Pt states she was given 0.5 mg of Xanax but was told she could have 2mg . Pt states she did not want to take 8 pills so the RN only administered 2 pills (0.5mg ).

## 2017-04-01 NOTE — ED Notes (Signed)
Family at bedside. 

## 2017-04-01 NOTE — ED Provider Notes (Signed)
[facility] DEPT Provider Note   CSN: [id number] Arrival date & time: 04/01/17  1603     History   Chief Complaint Chief Complaint  Patient presents with  . Seizures    HPI [name] is a 36 y.o. female.  HPI  36 year old female with a history of ADD and anxiety presents after a witnessed seizure at work. The patient does not remember what happened. Mom is present and talk to staff who state that she was at the counter helping a customer when all of a sudden she'll return to the right like she was going to say something and then fell over. She hit her head on a metal component of the counter on the way down. She then convulsed.  EMS reported that the grand mal seizure was about 30-60 seconds, mom states that a coworker told her was about 5 minutes. She was then confused and agitated for 10-20 minutes. Now she seems back to herself according to the patient. Mom states that her eyes look more dilated than normal and that she thinks her speech is "off". The patient has a headache where she hit her head over her left temple, denies any other headache. She states that her child and her have had a cold recently that she is now getting over. The patient  had some congestion and sore throat but no fevers. She has not had a headache prior to this. She thinks she remembers being on a step stool and then feeling off balance and having to step down. Otherwise has not been dizzy, no chest pain, shortness of breath, or palpitations. No weakness, numbness, or vomiting.  Past Medical History:  Diagnosis Date  . ADD (attention deficit disorder)   . Anxiety     Patient Active Problem List   Diagnosis Date Noted  . ADD (attention deficit disorder)   . Anxiety     History reviewed. No pertinent surgical history.  OB History    Gravida Para Term Preterm AB Living   1             SAB TAB Ectopic Multiple Live Births                   Home Medications    Prior to Admission medications    Medication Sig Start Date End Date Taking? Authorizing Provider  ALPRAZolam Prudy Feeler) 1 MG tablet Take 1 mg by mouth at bedtime as needed for anxiety.   Yes [provider]  amphetamine-dextroamphetamine (ADDERALL) 20 MG tablet Take 20 mg by mouth 3 (three) times daily.   Yes [provider]  cetirizine (ZYRTEC) 10 MG tablet Take 10 mg by mouth daily.   Yes [provider]  Melatonin 3 MG TABS Take 3 mg by mouth as needed (sleep).   Yes [provider]  lidocaine (XYLOCAINE) 2 % solution Use as directed 20 mLs in the mouth or throat as needed for mouth pain. Patient not taking: Reported on 04/01/2017 11/05/14   Forcucci, Toni Amend, PA-C  meclizine (ANTIVERT) 25 MG tablet Take 1 tablet (25 mg total) by mouth 3 (three) times daily as needed for dizziness. 04/01/17   Pricilla Loveless, MD  mometasone (NASONEX) 50 MCG/ACT nasal spray Place 2 sprays into the nose daily. 11/05/14 11/05/15  Forcucci, Courtney, PA-C  montelukast (SINGULAIR) 10 MG tablet Take 1 tablet (10 mg total) by mouth daily. 04/08/15 04/07/16  Lucky Cowboy, MD  ondansetron (ZOFRAN ODT) 4 MG disintegrating tablet Take 1 tablet (4 mg  total) by mouth every 8 (eight) hours as needed for nausea or vomiting. 04/01/17   Pricilla LovelessGoldston, Jailey Booton, MD  predniSONE (DELTASONE) 20 MG tablet Take 3 tabs x 5 days, then 2 tabs x 5 days, then 1 tab x 5 days Patient not taking: Reported on 04/01/2017 11/12/14   Lucky CowboyMcKeown, William, MD    Family History Family History  Problem Relation Age of Onset  . Arthritis Mother   . Hyperlipidemia Father     Social History Social History  Substance Use Topics  . Smoking status: Never Smoker  . Smokeless tobacco: Not on file  . Alcohol use No     Allergies   Patient has no known allergies.   Review of Systems Review of Systems  Constitutional: Negative for fever.  HENT: Positive for congestion and sore throat.   Eyes: Negative for visual disturbance.  Respiratory: Negative for shortness  of breath.   Cardiovascular: Negative for chest pain.  Gastrointestinal: Negative for vomiting.  Musculoskeletal: Negative for neck pain and neck stiffness.  Neurological: Positive for seizures and headaches. Negative for weakness and numbness.  All other systems reviewed and are negative.    Physical Exam Updated Vital Signs BP 137/69   Pulse 100   Temp 98 F (36.7 C) (Oral)   Resp 19   Wt 77.1 kg (170 lb)   SpO2 99%   BMI 30.60 kg/m   Physical Exam  Constitutional: She is oriented to person, place, and time. She appears well-developed and well-nourished. No distress.  HENT:  Head: Normocephalic. Head is with abrasion and with contusion.    Right Ear: External ear normal.  Left Ear: External ear normal.  Nose: Nose normal.  Mouth/Throat: Oropharynx is clear and moist.  Eyes: EOM are normal. Right eye exhibits no discharge. Left eye exhibits no discharge. Right pupil is reactive. Left pupil is reactive.  Pupils are bilaterally quite dilated but round and react to light bilaterally  Neck: Normal range of motion. Neck supple. No spinous process tenderness and no muscular tenderness present.  Cardiovascular: Normal rate, regular rhythm and normal heart sounds.   Pulmonary/Chest: Effort normal and breath sounds normal.  Abdominal: Soft. There is no tenderness.  Neurological: She is alert and oriented to person, place, and time.  CN 3-12 grossly intact. 5/5 strength in all 4 extremities. Grossly normal sensation. Normal finger to nose.   Skin: Skin is warm and dry. She is not diaphoretic.  Nursing note and vitals reviewed.    ED Treatments / Results  Labs (all labs ordered are listed, but only abnormal results are displayed) Labs Reviewed  BASIC METABOLIC PANEL - Abnormal; Notable for the following:       Result Value   Glucose, Bld 101 (*)    All other components within normal limits  URINALYSIS, ROUTINE W REFLEX MICROSCOPIC - Abnormal; Notable for the following:     APPearance CLOUDY (*)    Ketones, ur 20 (*)    Protein, ur 30 (*)    Leukocytes, UA SMALL (*)    Squamous Epithelial / LPF 6-30 (*)    All other components within normal limits  RAPID URINE DRUG SCREEN, HOSP PERFORMED - Abnormal; Notable for the following:    Benzodiazepines POSITIVE (*)    Amphetamines POSITIVE (*)    All other components within normal limits  CBG MONITORING, ED - Abnormal; Notable for the following:    Glucose-Capillary 109 (*)    All other components within normal limits  CBC WITH DIFFERENTIAL/PLATELET  MAGNESIUM  I-STAT BETA HCG BLOOD, ED ([facility], [facility], [facility] ONLY)    EKG  EKG Interpretation None       Radiology Ct Head Wo Contrast  Result Date: 04/01/2017 CLINICAL DATA:  36 year old female with acute seizure, fall and head injury. EXAM: CT HEAD WITHOUT CONTRAST TECHNIQUE: Contiguous axial images were obtained from the base of the skull through the vertex without intravenous contrast. COMPARISON:  05/08/2013 CT FINDINGS: Brain: No evidence of infarction, hemorrhage, hydrocephalus, extra-axial collection or mass lesion/mass effect. Vascular: No hyperdense vessel or unexpected calcification. Skull: Normal. Negative for fracture or focal lesion. Sinuses/Orbits: No acute finding. Other: Left scalp soft tissue swelling/ hematoma noted. IMPRESSION: 1. No evidence of intracranial abnormality 2. Left scalp soft tissue swelling/hematoma without fracture. Electronically Signed   By: Harmon Pier M.D.   On: 04/01/2017 17:08    Procedures Procedures (including critical care time)  Medications Ordered in ED Medications  sodium chloride 0.9 % bolus 1,000 mL (0 mLs Intravenous Stopped 04/01/17 1855)  acetaminophen (TYLENOL) tablet 650 mg (650 mg Oral Given 04/01/17 1845)  ondansetron (ZOFRAN) injection 4 mg (4 mg Intravenous Given 04/01/17 1655)  ALPRAZolam (XANAX) tablet 2 mg (0.5 mg Oral Given 04/01/17 1846)  ALPRAZolam (XANAX) tablet 1 mg (1 mg Oral Given 04/01/17 2005)     Initial  Impression / Assessment and Plan / ED Course  I have reviewed the triage vital signs and the nursing notes.  Pertinent labs & imaging results that were available during my care of the patient were reviewed by me and considered in my medical decision making (see chart for details).  Clinical Course as of Apr 03 107  Wynelle Link Apr 01, 2017  1654 When changing from the stretcher to the CT scanner and laying flat, the patient became very dizzy and states it felt like the room was spinning. She then vomited. She is no longer nauseated but just doesn't feel quite well. No further seizure-like activity. Will give Zofran and then get the CT scan.  [SG]    Clinical Course User Index [SG] Pricilla Loveless, MD    No further seizure like activity. No further vomiting or dizziness. Likely a concussion with negative head CT. Neuro exam unremarkable. D/w Neuro, given first time seizure with no other complicating factors, no further treatment, needs outpatient neuro f/u. Counseled on seizure precautions, including no driving. Prior to d/c, tells me she hasn't taken her 2 mg bid xanax since yesterday morning (took 1 mg). Given xanax in ED. However this is probably not benzo withdrawal given it's only been 36 hours and no other signs/symptoms. No infectious symptoms. Strict return precautions.   Final Clinical Impressions(s) / ED Diagnoses   Final diagnoses:  Seizure (HCC)  Concussion with loss of consciousness of 30 minutes or less, initial encounter    New Prescriptions Discharge Medication List as of 04/01/2017  8:12 PM    START taking these medications   Details  meclizine (ANTIVERT) 25 MG tablet Take 1 tablet (25 mg total) by mouth 3 (three) times daily as needed for dizziness., Starting Sun 04/01/2017, Print    ondansetron (ZOFRAN ODT) 4 MG disintegrating tablet Take 1 tablet (4 mg total) by mouth every 8 (eight) hours as needed for nausea or vomiting., Starting Sun 04/01/2017, Print         Pricilla Loveless, MD 04/02/17 0110

## 2017-04-02 ENCOUNTER — Other Ambulatory Visit: Payer: Self-pay | Admitting: Internal Medicine

## 2019-05-28 ENCOUNTER — Other Ambulatory Visit: Payer: Self-pay

## 2019-05-28 DIAGNOSIS — Z20822 Contact with and (suspected) exposure to covid-19: Secondary | ICD-10-CM

## 2019-05-29 LAB — NOVEL CORONAVIRUS, NAA: SARS-CoV-2, NAA: NOT DETECTED

## 2019-06-03 ENCOUNTER — Telehealth: Payer: Self-pay | Admitting: Nurse Practitioner

## 2019-06-03 DIAGNOSIS — Z20822 Contact with and (suspected) exposure to covid-19: Secondary | ICD-10-CM

## 2019-06-03 DIAGNOSIS — Z20828 Contact with and (suspected) exposure to other viral communicable diseases: Secondary | ICD-10-CM

## 2019-06-03 DIAGNOSIS — R432 Parageusia: Secondary | ICD-10-CM

## 2019-06-03 DIAGNOSIS — R0602 Shortness of breath: Secondary | ICD-10-CM

## 2019-06-03 DIAGNOSIS — R519 Headache, unspecified: Secondary | ICD-10-CM

## 2019-06-03 DIAGNOSIS — R430 Anosmia: Secondary | ICD-10-CM

## 2019-06-03 MED ORDER — ALBUTEROL SULFATE (2.5 MG/3ML) 0.083% IN NEBU: 2.5000 mg | INHALATION_SOLUTION | Freq: Four times a day (QID) | RESPIRATORY_TRACT | 1 refills | Status: DC | PRN

## 2019-06-03 NOTE — Progress Notes (Signed)
Erroneous encounter

## 2019-06-03 NOTE — Progress Notes (Signed)
E-Visit for Corona Virus Screening   Your current symptoms could be consistent with the coronavirus.  Many health care providers can now test patients at their office but not all are.  [place] has multiple testing sites. For information on our COVID testing locations and hours go to [email or web address]  Please quarantine yourself while awaiting your test results.  We are enrolling you in our [place] for [place] . Daily you will receive a questionnaire within the [place] website. Our COVID 19 response team willl be monitoriing your responses daily.  You can go to one of the  testing sites listed below, while they are opened (see hours). You do not need a doctors order to be tested for covid.You do need to self-isolate until your results return and if positive 14 days from when your symptoms started and until you are 3 days symptom free.   Testing Locations (Monday - Friday, 8 a.m. - 3:30 p.m.)   [place]: [facility] Entrance), [address], [place], [place]: [address], [place], [place], [place] (entrance off [address] (Closed each Monday): [address], [place], [place] - the short stay covered drive at [facility] (Use the [employer] entrance to [facility] next to [facility].) -   testing  COVID-19 is a respiratory illness with symptoms that are similar to the flu. Symptoms are typically mild to moderate, but there have been cases of severe illness and death due to the virus. The following symptoms may appear 2-14 days after exposure: . Fever . Cough . Shortness of breath or difficulty breathing . Chills . Repeated shaking with chills . Muscle pain . Headache . Sore throat . New loss of taste or smell . Fatigue . Congestion or runny nose . Nausea or vomiting . Diarrhea  It is  vitally important that if you feel that you have an infection such as this virus or any other virus that you stay home and away from places where you may spread it to others.  You should self-quarantine for 14 days if you have symptoms that could potentially be coronavirus or have been in close contact a with a person diagnosed with COVID-19 within the last 2 weeks. You should avoid contact with people age 6 and older.   You should wear a mask or cloth face covering over your nose and mouth if you must be around other people or animals, including pets (even at home). Try to stay at least 6 feet away from other people. This will protect the people around you.  You can use medication such as A prescription inhaler called Albuterol MDI 90 mcg /actuation 2 puffs every 4 hours as needed for shortness of breath, wheezing, cough  You may also take acetaminophen (Tylenol) as needed for fever.   Reduce your risk of any infection by using the same precautions used for avoiding the common cold or flu:  Marland Kitchen Wash your hands often with soap and warm water for at least 20 seconds.  If soap and water are not readily available, use an alcohol-based hand sanitizer with at least 60% alcohol.  . If coughing or sneezing, cover your mouth and nose by coughing or sneezing into the elbow areas of your shirt or coat, into a tissue or into your sleeve (not your hands). . Avoid shaking hands with others and consider head nods  or verbal greetings only. . Avoid touching your eyes, nose, or mouth with unwashed hands.  . Avoid close contact with people who are sick. . Avoid places or events with large numbers of people in one location, like concerts or sporting events. . Carefully consider travel plans you have or are making. . If you are planning any travel outside or inside the [place], visit the CDC's Travelers' Health webpage for the latest health notices. . If you have some symptoms but not all symptoms, continue to monitor at  home and seek medical attention if your symptoms worsen. . If you are having a medical emergency, call 911.  HOME CARE . Only take medications as instructed by your medical team. . Drink plenty of fluids and get plenty of rest. . A steam or ultrasonic humidifier can help if you have congestion.   GET HELP RIGHT AWAY IF YOU HAVE EMERGENCY WARNING SIGNS** FOR COVID-19. If you or someone is showing any of these signs seek emergency medical care immediately. Call 911 or proceed to your closest emergency facility if: . You develop worsening high fever. . Trouble breathing . Bluish lips or face . Persistent pain or pressure in the chest . New confusion . Inability to wake or stay awake . You cough up blood. . Your symptoms become more severe  **This list is not all possible symptoms. Contact your medical provider for any symptoms that are sever or concerning to you.   MAKE SURE YOU   Understand these instructions.  Will watch your condition.  Will get help right away if you are not doing well or get worse.  Your e-visit answers were reviewed by a board certified advanced clinical practitioner to complete your personal care plan.  Depending on the condition, your plan could have included both over the counter or prescription medications.  If there is a problem please reply once you have received a response from your provider.  Your safety is important to [place].  If you have drug allergies check your prescription carefully.    You can use MyChart to ask questions about today's visit, request a non-urgent call back, or ask for a work or school excuse for 24 hours related to this e-Visit. If it has been greater than 24 hours you will need to follow up with your provider, or enter a new e-Visit to address those concerns. You will get an e-mail in the next two days asking about your experience.  I hope that your e-visit has been valuable and will speed your recovery. Thank you for using  e-visits.   5-10 minutes spent reviewing and documenting in chart.

## 2019-06-09 ENCOUNTER — Encounter (INDEPENDENT_AMBULATORY_CARE_PROVIDER_SITE_OTHER): Payer: Self-pay

## 2019-06-09 ENCOUNTER — Telehealth: Payer: Self-pay

## 2019-06-09 ENCOUNTER — Telehealth: Payer: Self-pay | Admitting: Physician Assistant

## 2019-06-09 DIAGNOSIS — Z20822 Contact with and (suspected) exposure to covid-19: Secondary | ICD-10-CM

## 2019-06-09 DIAGNOSIS — R059 Cough, unspecified: Secondary | ICD-10-CM

## 2019-06-09 DIAGNOSIS — R0602 Shortness of breath: Secondary | ICD-10-CM

## 2019-06-09 DIAGNOSIS — Z20828 Contact with and (suspected) exposure to other viral communicable diseases: Secondary | ICD-10-CM

## 2019-06-09 DIAGNOSIS — R05 Cough: Secondary | ICD-10-CM

## 2019-06-09 MED ORDER — BENZONATATE 100 MG PO CAPS: 100.0000 mg | ORAL_CAPSULE | Freq: Three times a day (TID) | ORAL | 0 refills | Status: AC | PRN

## 2019-06-09 NOTE — Telephone Encounter (Signed)
Patient advise on cough per protocol:   If cough remains the same or better: continue to treat with over the counter medications. Hard candy or cough drops and drinking warm fluids. Adults can also use honey 2 tsp (10 ML) at bedtime.   HONEY IS NOT RECOMMENDED FOR INFANTS UNDER ONE.    If cough is becoming worse even with the use of over the counter medications and patient is not able to sleep at night, cough becomes productive with sputum that maybe yellow or green in color, contact PCP.  Patient states that she developed cough today. Patient verbal understanding and agrees with plan.

## 2019-06-09 NOTE — Progress Notes (Signed)
E-Visit for Corona Virus Screening   Hi [name],  I am sorry you are not feeling well.   For a retest, you should wait 2 weeks.  In your case, if you are still not feeling well, you can retest Nov 11.   Your current symptoms could be consistent with the coronavirus.  Many health care providers can now test patients at their office but not all are.  [facility] has multiple testing sites. For information on our COVID testing locations and hours go to [email or web address]  Please quarantine yourself while awaiting your test results.  We are enrolling you in our [facility] [place] for COVID19 . Daily you will receive a questionnaire within the MyChart website. Our COVID 19 response team willl be monitoriing your responses daily.    COVID-19 is a respiratory illness with symptoms that are similar to the flu. Symptoms are typically mild to moderate, but there have been cases of severe illness and death due to the virus. The following symptoms may appear 2-14 days after exposure: . Fever . Cough . Shortness of breath or difficulty breathing . Chills . Repeated shaking with chills . Muscle pain . Headache . Sore throat . New loss of taste or smell . Fatigue . Congestion or runny nose . Nausea or vomiting . Diarrhea  It is vitally important that if you feel that you have an infection such as this virus or any other virus that you stay home and away from places where you may spread it to others.  You should self-quarantine for 14 days if you have symptoms that could potentially be coronavirus or have been in close contact a with a person diagnosed with COVID-19 within the last 2 weeks. You should avoid contact with people age 63 and older.   You should wear a mask or cloth face covering over your nose and mouth if you must be around other people or animals, including pets (even at home). Try to stay at least 6 feet away from other people. This will protect the people  around you.  You can use medication such as A prescription cough medication called Tessalon Perles 100 mg. You may take 1-2 capsules every 8 hours as needed for cough  You may also take acetaminophen (Tylenol) as needed for fever.   Reduce your risk of any infection by using the same precautions used for avoiding the common cold or flu:  Marland Kitchen Wash your hands often with soap and warm water for at least 20 seconds.  If soap and water are not readily available, use an alcohol-based hand sanitizer with at least 60% alcohol.  . If coughing or sneezing, cover your mouth and nose by coughing or sneezing into the elbow areas of your shirt or coat, into a tissue or into your sleeve (not your hands). . Avoid shaking hands with others and consider head nods or verbal greetings only. . Avoid touching your eyes, nose, or mouth with unwashed hands.  . Avoid close contact with people who are sick. . Avoid places or events with large numbers of people in one location, like concerts or sporting events. . Carefully consider travel plans you have or are making. . If you are planning any travel outside or inside the [place], visit the CDC's Travelers' Health webpage for the latest health notices. . If you have some symptoms but not all symptoms, continue to monitor at home and seek medical attention if your symptoms worsen. . If you are having a medical emergency,  call 911.  HOME CARE . Only take medications as instructed by your medical team. . Drink plenty of fluids and get plenty of rest. . A steam or ultrasonic humidifier can help if you have congestion.   GET HELP RIGHT AWAY IF YOU HAVE EMERGENCY WARNING SIGNS** FOR COVID-19. If you or someone is showing any of these signs seek emergency medical care immediately. Call 911 or proceed to your closest emergency facility if: . You develop worsening high fever. . Trouble breathing . Bluish lips or face . Persistent pain or pressure in the chest . New  confusion . Inability to wake or stay awake . You cough up blood. . Your symptoms become more severe  **This list is not all possible symptoms. Contact your medical provider for any symptoms that are sever or concerning to you.   MAKE SURE YOU   Understand these instructions.  Will watch your condition.  Will get help right away if you are not doing well or get worse.  Your e-visit answers were reviewed by a board certified advanced clinical practitioner to complete your personal care plan.  Depending on the condition, your plan could have included both over the counter or prescription medications.  If there is a problem please reply once you have received a response from your provider.  Your safety is important to [place].  If you have drug allergies check your prescription carefully.    You can use MyChart to ask questions about today's visit, request a non-urgent call back, or ask for a work or school excuse for 24 hours related to this e-Visit. If it has been greater than 24 hours you will need to follow up with your provider, or enter a new e-Visit to address those concerns. You will get an e-mail in the next two days asking about your experience.  I hope that your e-visit has been valuable and will speed your recovery. Thank you for using e-visits.   Greater than 5 minutes, yet less than 10 minutes of time have been spent researching, coordinating and implementing care for this patient today.

## 2019-06-10 ENCOUNTER — Encounter (INDEPENDENT_AMBULATORY_CARE_PROVIDER_SITE_OTHER): Payer: Self-pay

## 2019-06-11 ENCOUNTER — Encounter (INDEPENDENT_AMBULATORY_CARE_PROVIDER_SITE_OTHER): Payer: Self-pay

## 2019-06-11 ENCOUNTER — Telehealth: Payer: Self-pay

## 2019-06-11 NOTE — Telephone Encounter (Signed)
Left a vm for patient to return call regarding questionnaire  

## 2019-06-11 NOTE — Telephone Encounter (Signed)
Patient advise on sob, diarrhea, change in appetite per protocol:    If appetite becomes worse: encourage patient to drink fluids as tolerated, work their way up to bland solid food such as crackers, pretzels, soup, bread or applesauce and boiled starches.   If patient is unable to tolerate any foods or liquids, notify PCP.   IF PATIENT DEVELOPS SEVERE VOMITING (MORE THAN 6 TIMES A DAY AND OR >8 HOURS) AND/OR SEVERE ABDOMINAL PAIN ADVISE PATIENT TO CALL 911 AND SEEK TREATMENT IN ED   Shortness of breath is the same: continue to monitor at home   If symptoms become severe, i.e. shortness of breath at rest, gasping for air, wheezing, CALL 911 AND SEEK TREATMENT IN THE ED   If diarrhea remains the same: encourage patient to drink oral fluids and bland foods.   Avoid alcohol, spicy foods, caffeine or fatty foods that could make diarrhea worse.   Continue to monitor for signs of dehydration (increased thirst decreased urine output, yellow urine, dry skin, headache or dizziness).   Advise patient to try OTC medication (Imodium, kaopectate, Pepto-Bismol) as per manufacturer's instructions.   If worsening diarrhea occurs and becomes severe (6-7 bowel movements a day): notify PCP   If diarrhea last greater than 7 days: notify PCP   IF SIGNS OF DEHYDRATION OCCUR (INCREASED THIRST, DECREASED URINE OUTPUT, YELLOW URINE, DRY SKIN, HEADACHE OR DIZZINESS) ADVISE PATIENT TO CALL 911 AND SEEK TREATMENT IN THE ED  Patient states that she has had 2 bowel movements today. Patient also states that she is not able to get a full breath at times. Patient had a e-visit and was given medication. Patient verbalized understanding and agrees with plan.

## 2019-06-12 ENCOUNTER — Other Ambulatory Visit: Payer: Self-pay

## 2019-06-12 ENCOUNTER — Inpatient Hospital Stay
Admission: RE | Admit: 2019-06-12 | Discharge: 2019-06-12 | Disposition: A | Discharge status: Home / Self Care | Payer: Self-pay | Source: Ambulatory Visit

## 2019-06-12 ENCOUNTER — Encounter (HOSPITAL_COMMUNITY): Payer: Self-pay

## 2019-06-12 ENCOUNTER — Encounter (INDEPENDENT_AMBULATORY_CARE_PROVIDER_SITE_OTHER): Payer: Self-pay

## 2019-06-12 ENCOUNTER — Ambulatory Visit (HOSPITAL_COMMUNITY)
Admission: EM | Admit: 2019-06-12 | Discharge: 2019-06-12 | Disposition: A | Discharge status: Home / Self Care | Payer: HRSA Program | Attending: Family Medicine | Admitting: Family Medicine

## 2019-06-12 DIAGNOSIS — R509 Fever, unspecified: Secondary | ICD-10-CM | POA: Diagnosis present

## 2019-06-12 DIAGNOSIS — Z79899 Other long term (current) drug therapy: Secondary | ICD-10-CM | POA: Insufficient documentation

## 2019-06-12 DIAGNOSIS — F419 Anxiety disorder, unspecified: Secondary | ICD-10-CM | POA: Diagnosis not present

## 2019-06-12 DIAGNOSIS — B349 Viral infection, unspecified: Secondary | ICD-10-CM

## 2019-06-12 DIAGNOSIS — U071 COVID-19: Secondary | ICD-10-CM | POA: Diagnosis not present

## 2019-06-12 MED ORDER — ALBUTEROL SULFATE HFA 108 (90 BASE) MCG/ACT IN AERS: 1.0000 | INHALATION_SPRAY | Freq: Four times a day (QID) | RESPIRATORY_TRACT | 0 refills | Status: AC | PRN

## 2019-06-12 NOTE — Discharge Instructions (Addendum)
This is some sort of viral illness.  Highly suspicious for Covid. You can continue the [facility] for cough.  Tylenol or ibuprofen for pain, fever Albuterol inhaler as needed Quarantine precautions

## 2019-06-12 NOTE — ED Triage Notes (Signed)
Pt reports having cough, SOB, chills, fever, generalized body aches x 7 days. Pt is using Tessalon without relief.

## 2019-06-12 NOTE — ED Provider Notes (Signed)
[facility]    CSN: [id number] Arrival date & time: 06/12/19  1051      History   Chief Complaint Chief Complaint  Patient presents with  . Chills  . Generalized Body Aches  . Shortness of Breath  . Cough  . Fever    HPI [name] is a 38 y.o. female.    Fever Max temp prior to arrival:  101 Temp source:  Temporal Severity:  Moderate Duration:  7 days Timing:  Constant Progression:  Waxing and waning Chronicity:  New Relieved by:  Ibuprofen Worsened by:  Nothing Associated symptoms: chills, congestion, cough, myalgias and sore throat   Associated symptoms: no chest pain, no confusion, no diarrhea, no dysuria, no ear pain, no headaches, no nausea, no rash, no rhinorrhea, no somnolence and no vomiting   Risk factors: sick contacts   Risk factors comment:  COVID exposure    Past Medical History:  Diagnosis Date  . ADD (attention deficit disorder)   . Anxiety     Patient Active Problem List   Diagnosis Date Noted  . ADD (attention deficit disorder)   . Anxiety     History reviewed. No pertinent surgical history.  OB History    Gravida  1   Para      Term      Preterm      AB      Living        SAB      TAB      Ectopic      Multiple      Live Births               Home Medications    Prior to Admission medications   Medication Sig Start Date End Date Taking? Authorizing Provider  albuterol (VENTOLIN HFA) 108 (90 Base) MCG/ACT inhaler Inhale 1-2 puffs into the lungs every 6 (six) hours as needed for wheezing or shortness of breath. 06/12/19   Loura Halt A, NP  ALPRAZolam Duanne Moron) 1 MG tablet Take 1 mg by mouth at bedtime as needed for anxiety.    [provider]  amphetamine-dextroamphetamine (ADDERALL) 20 MG tablet Take 20 mg by mouth 3 (three) times daily.    [provider]  benzonatate (TESSALON) 100 MG capsule Take 1-2 capsules (100-200 mg total) by mouth 3 (three) times daily as needed for  cough. 06/09/19   McVey, Gelene Mink, PA-C  cetirizine (ZYRTEC) 10 MG tablet Take 10 mg by mouth daily.    [provider]  clonazePAM (KLONOPIN) 1 MG tablet Take by mouth.    [provider]  Melatonin 3 MG TABS Take 3 mg by mouth as needed (sleep).    [provider]  montelukast (SINGULAIR) 10 MG tablet Take 1 tablet (10 mg total) by mouth daily. 04/08/15 04/07/16  Unk Pinto, MD  mometasone (NASONEX) 50 MCG/ACT nasal spray Place 2 sprays into the nose daily. 11/05/14 06/12/19  Rolene Course, PA-C    Family History Family History  Problem Relation Age of Onset  . Arthritis Mother   . Hyperlipidemia Father     Social History Social History   Tobacco Use  . Smoking status: Never Smoker  . Smokeless tobacco: Never Used  Substance Use Topics  . Alcohol use: No  . Drug use: No     Allergies   Patient has no known allergies.   Review of Systems Review of Systems  Constitutional: Positive for chills and fever.  HENT:  Positive for congestion and sore throat. Negative for ear pain and rhinorrhea.   Respiratory: Positive for cough.   Cardiovascular: Negative for chest pain.  Gastrointestinal: Negative for diarrhea, nausea and vomiting.  Genitourinary: Negative for dysuria.  Musculoskeletal: Positive for myalgias.  Skin: Negative for rash.  Neurological: Negative for headaches.  Psychiatric/Behavioral: Negative for confusion.     Physical Exam Triage Vital Signs ED Triage Vitals  Enc Vitals Group     BP 06/12/19 1204 132/70     Pulse Rate 06/12/19 1204 92     Resp 06/12/19 1204 18     Temp 06/12/19 1204 100.1 F (37.8 C)     Temp Source 06/12/19 1204 Temporal     SpO2 06/12/19 1204 98 %     Weight --      Height --      Head Circumference --      Peak Flow --      Pain Score 06/12/19 1200 6     Pain Loc --      Pain Edu? --      Excl. in GC? --    No data found.  Updated Vital Signs BP 132/70 (BP Location: Left Arm)    Pulse 92   Temp 100.1 F (37.8 C) (Temporal)   Resp 18   LMP  (Within Weeks) Comment: 3 weeks ago  SpO2 98%   Visual Acuity Right Eye Distance:   Left Eye Distance:   Bilateral Distance:    Right Eye Near:   Left Eye Near:    Bilateral Near:     Physical Exam Vitals signs and nursing note reviewed.  Constitutional:      General: She is not in acute distress.    Appearance: She is ill-appearing. She is not toxic-appearing.  HENT:     Head: Normocephalic.     Right Ear: Tympanic membrane and ear canal normal.     Left Ear: Tympanic membrane and ear canal normal.     Nose: Congestion present.     Mouth/Throat:     Pharynx: Posterior oropharyngeal erythema present.  Eyes:     Conjunctiva/sclera: Conjunctivae normal.  Neck:     Musculoskeletal: Normal range of motion.  Cardiovascular:     Rate and Rhythm: Normal rate and regular rhythm.     Pulses: Normal pulses.     Heart sounds: Normal heart sounds.  Pulmonary:     Effort: Pulmonary effort is normal.     Breath sounds: Normal breath sounds.  Musculoskeletal: Normal range of motion.  Skin:    General: Skin is warm and dry.     Findings: No rash.  Neurological:     Mental Status: She is alert.  Psychiatric:        Mood and Affect: Mood normal.      UC Treatments / Results  Labs (all labs ordered are listed, but only abnormal results are displayed) Labs Reviewed  NOVEL CORONAVIRUS, NAA (HOSP ORDER, SEND-OUT TO REF LAB; TAT 18-24 HRS)    EKG   Radiology No results found.  Procedures Procedures (including critical care time)  Medications Ordered in UC Medications - No data to display  Initial Impression / Assessment and Plan / UC Course  I have reviewed the triage vital signs and the nursing notes.  Pertinent labs & imaging results that were available during my care of the patient were reviewed by me and considered in my medical decision making (see chart for details).     Viral  illness-highly  suspicious for Covid She can continue the [employer] Perles for cough use the albuterol inhaler as needed.  Over-the-counter medications for other symptoms. Quarantine precautions given and note given  Final Clinical Impressions(s) / UC Diagnoses   Final diagnoses:  Viral illness     Discharge Instructions     This is some sort of viral illness.  Highly suspicious for Covid. You can [facility] the [facility] for cough.  Tylenol or ibuprofen for pain, fever Albuterol inhaler as needed Quarantine precautions    ED Prescriptions    Medication Sig Dispense Auth. Provider   albuterol (VENTOLIN HFA) 108 (90 Base) MCG/ACT inhaler Inhale 1-2 puffs into the lungs every 6 (six) hours as needed for wheezing or shortness of breath. 18 g Dahlia ByesBast, Mohab Ashby A, NP     PDMP not reviewed this encounter.   Janace ArisBast, Shoaib Siefker A, NP 06/12/19 1251

## 2019-06-13 ENCOUNTER — Telehealth: Payer: Self-pay

## 2019-06-13 ENCOUNTER — Encounter (INDEPENDENT_AMBULATORY_CARE_PROVIDER_SITE_OTHER): Payer: Self-pay

## 2019-06-13 NOTE — Telephone Encounter (Signed)
Left a vm for patient to callback in regards to the mychart questionnaire.

## 2019-06-14 ENCOUNTER — Telehealth (HOSPITAL_COMMUNITY): Payer: Self-pay | Admitting: *Deleted

## 2019-06-14 ENCOUNTER — Encounter (INDEPENDENT_AMBULATORY_CARE_PROVIDER_SITE_OTHER): Payer: Self-pay

## 2019-06-14 LAB — NOVEL CORONAVIRUS, NAA (HOSP ORDER, SEND-OUT TO REF LAB; TAT 18-24 HRS): SARS-CoV-2, NAA: DETECTED — AB

## 2019-06-14 NOTE — Telephone Encounter (Signed)
Returned pt call inquiring about Covid test results.  Informed pt results are still pending.  Verified pt has access to MyChart.  Recommended pt monitor MyChart for results.  Pt verbalized understanding.

## 2019-06-15 ENCOUNTER — Encounter (INDEPENDENT_AMBULATORY_CARE_PROVIDER_SITE_OTHER): Payer: Self-pay

## 2019-06-15 ENCOUNTER — Telehealth: Payer: Self-pay | Admitting: *Deleted

## 2019-06-15 NOTE — Telephone Encounter (Signed)
BPA for worsening cough received from MyChart COVID symptom questionnaire.  Pt states that she can hardly talk due to coughing.Patient went to urgent care on 06/12/19 and had COVID-19 testing. Pt was given a prescription for albuterol that she has not picked up at this time and tessalon. Pt states she has been using the tessalon pearls at night and it helps a little but feels like it makes her worse because her cough is suppressed. Pt states when she starts coughing too much she starts throwing up.   Pt informed that COVID-19 test was positive from 1112/20. Pt verbalized understanding. Pt states she had been around a neighbor everyday since the first of the month that had a false negative COVID test. Criteria for self isolating reviewed with patient and instructions given on good preventative measures. No PCP currently.Pt advised to set up a evisit or virtual visit for current symptoms. Understanding verbalized. [facility] Department notified.

## 2019-06-16 ENCOUNTER — Telehealth: Payer: Self-pay | Admitting: Emergency Medicine

## 2019-06-16 NOTE — Telephone Encounter (Signed)
Positive Covid detected on swab. Pt was notified yesterday during questionnaire. Attempted to reach patient, no answer, sent mychart message to make sure she had no further needs.

## 2019-06-21 ENCOUNTER — Encounter (INDEPENDENT_AMBULATORY_CARE_PROVIDER_SITE_OTHER): Payer: Self-pay

## 2019-06-22 ENCOUNTER — Encounter (INDEPENDENT_AMBULATORY_CARE_PROVIDER_SITE_OTHER): Payer: Self-pay

## 2020-03-30 ENCOUNTER — Ambulatory Visit: Payer: Self-pay | Attending: Internal Medicine

## 2020-03-30 DIAGNOSIS — Z23 Encounter for immunization: Secondary | ICD-10-CM

## 2020-03-30 NOTE — Progress Notes (Signed)
   Covid-19 Vaccination Clinic  Name:  [name]    MRN: [id number] DOB: Oct 22, 1980  03/30/2020  [name] was observed post Covid-19 immunization for 15 minutes without incident. She was provided with Vaccine Information Sheet and instruction to access the V-Safe system.   [name] was instructed to call 911 with any severe reactions post vaccine: Marland Kitchen Difficulty breathing  . Swelling of face and throat  . A fast heartbeat  . A bad rash all over body  . Dizziness and weakness   Immunizations Administered    Name Date Dose VIS Date Route   Pfizer COVID-19 Vaccine 03/30/2020  2:35 PM 0.3 mL 09/24/2018 Intramuscular   Manufacturer: [employer], [employer]   Lot: [id number]   NDC: [address]-2

## 2020-04-20 ENCOUNTER — Ambulatory Visit: Payer: Self-pay

## 2020-09-18 ENCOUNTER — Emergency Department (HOSPITAL_COMMUNITY)
Admission: EM | Admit: 2020-09-18 | Discharge: 2020-09-19 | Disposition: A | Discharge status: Home / Self Care | Payer: Medicaid Other | Attending: Emergency Medicine | Admitting: Emergency Medicine

## 2020-09-18 ENCOUNTER — Other Ambulatory Visit: Payer: Self-pay

## 2020-09-18 DIAGNOSIS — S0003XA Contusion of scalp, initial encounter: Secondary | ICD-10-CM

## 2020-09-18 DIAGNOSIS — S0990XA Unspecified injury of head, initial encounter: Secondary | ICD-10-CM

## 2020-09-18 DIAGNOSIS — Z79899 Other long term (current) drug therapy: Secondary | ICD-10-CM | POA: Insufficient documentation

## 2020-09-18 DIAGNOSIS — Y92410 Unspecified street and highway as the place of occurrence of the external cause: Secondary | ICD-10-CM | POA: Insufficient documentation

## 2020-09-18 DIAGNOSIS — M542 Cervicalgia: Secondary | ICD-10-CM | POA: Insufficient documentation
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
## 2020-09-19 ENCOUNTER — Emergency Department (HOSPITAL_COMMUNITY): Payer: Medicaid Other
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
